# Patient Record
Sex: Male | Born: 1990
Health system: Southern US, Community
[De-identification: ages and names within clinical notes are randomized; demographics above are authoritative.]

---
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
## 2013-08-11 DIAGNOSIS — F331 Major depressive disorder, recurrent, moderate: Secondary | ICD-10-CM | POA: Insufficient documentation

## 2013-08-11 DIAGNOSIS — F1210 Cannabis abuse, uncomplicated: Secondary | ICD-10-CM | POA: Insufficient documentation

## 2013-08-11 DIAGNOSIS — F1011 Alcohol abuse, in remission: Secondary | ICD-10-CM | POA: Insufficient documentation

## 2013-08-11 DIAGNOSIS — F1511 Other stimulant abuse, in remission: Secondary | ICD-10-CM | POA: Insufficient documentation

## 2013-09-15 DIAGNOSIS — F3341 Major depressive disorder, recurrent, in partial remission: Secondary | ICD-10-CM | POA: Insufficient documentation

## 2019-06-16 ENCOUNTER — Emergency Department
Admission: EM | Admit: 2019-06-16 | Discharge: 2019-06-16 | Disposition: A | Discharge status: Home / Self Care | Payer: Self-pay | Source: Home / Self Care

## 2019-06-16 ENCOUNTER — Other Ambulatory Visit: Payer: Self-pay

## 2019-06-16 ENCOUNTER — Emergency Department (INDEPENDENT_AMBULATORY_CARE_PROVIDER_SITE_OTHER): Payer: Managed Care, Other (non HMO)

## 2019-06-16 DIAGNOSIS — Z9181 History of falling: Secondary | ICD-10-CM

## 2019-06-16 DIAGNOSIS — W19XXXA Unspecified fall, initial encounter: Secondary | ICD-10-CM

## 2019-06-16 DIAGNOSIS — M25552 Pain in left hip: Secondary | ICD-10-CM | POA: Diagnosis not present

## 2019-06-16 MED ORDER — MELOXICAM 15 MG PO TABS: 15.0000 mg | ORAL_TABLET | Freq: Every day | ORAL | 2 refills | Status: AC

## 2019-06-16 NOTE — ED Triage Notes (Signed)
Hip pain x 2 weeks. Slipped on ice at work. He works in a freezer and keeps reaggrivating the injury. Pain 4/10. Alternating heat/ice. Ibuprofen prn.

## 2019-06-16 NOTE — ED Provider Notes (Signed)
[facility]    CSN: [id number] Arrival date & time: 06/16/19  3220      History   Chief Complaint Chief Complaint  Patient presents with  . Hip Pain    HPI Brad Becker is a 29 y.o. male.   The history is provided by the patient. No language interpreter was used.  Hip Pain This is a new problem. Episode onset: 2 weeks ago  The problem occurs constantly. The problem has not changed since onset.Nothing aggravates the symptoms. Nothing relieves the symptoms. He has tried nothing for the symptoms. The treatment provided no relief.  Pt reports he slipped an injured his hip 2 weeks ago.  Pt reports he has continued to have pain   No past medical history on file.  There are no problems to display for this patient.        Home Medications    Prior to Admission medications   Not on File    Family History No family history on file.  Social History Social History   Tobacco Use  . Smoking status: Never Smoker  . Smokeless tobacco: Never Used  Substance Use Topics  . Alcohol use: Never  . Drug use: Not on file     Allergies   Patient has no known allergies.   Review of Systems Review of Systems  Musculoskeletal: Positive for arthralgias. Negative for joint swelling.  All other systems reviewed and are negative.    Physical Exam Triage Vital Signs ED Triage Vitals  Enc Vitals Group     BP 06/16/19 1018 114/72     Pulse Rate 06/16/19 1018 67     Resp 06/16/19 1018 18     Temp 06/16/19 1018 98.4 F (36.9 C)     Temp Source 06/16/19 1018 Oral     SpO2 06/16/19 1018 98 %     Weight 06/16/19 1019 202 lb (91.6 kg)     Height 06/16/19 1019 6\' 7"  (2.007 m)     Head Circumference --      Peak Flow --      Pain Score 06/16/19 1019 4     Pain Loc --      Pain Edu? --      Excl. in GC? --    No data found.  Updated Vital Signs BP 114/72 (BP Location: Right Arm)   Pulse 67   Temp 98.4 F (36.9 C) (Oral)   Resp 18   Ht 6\' 7"  (2.007 m)    Wt 91.6 kg   SpO2 98%   BMI 22.76 kg/m   Visual Acuity Right Eye Distance:   Left Eye Distance:   Bilateral Distance:    Right Eye Near:   Left Eye Near:    Bilateral Near:     Physical Exam Vitals reviewed.  Cardiovascular:     Rate and Rhythm: Normal rate.  Pulmonary:     Effort: Pulmonary effort is normal.  Musculoskeletal:        General: Tenderness and signs of injury present. No swelling or deformity.     Comments: Tender left hip anteriorly, pain with range of motion.  Pain with abduction,  From  nv and ns intact   Skin:    General: Skin is warm.  Neurological:     General: No focal deficit present.     Mental Status: He is alert.  Psychiatric:        Mood and Affect: Mood normal.      UC Treatments /  Results  Labs (all labs ordered are listed, but only abnormal results are displayed) Labs Reviewed - No data to display  EKG   Radiology DG HIP UNILAT WITH PELVIS 2-3 VIEWS LEFT  Result Date: 06/16/2019 CLINICAL DATA:  Left hip pain for 2 weeks following repeated falls, initial encounter EXAM: DG HIP (WITH OR WITHOUT PELVIS) 3V LEFT COMPARISON:  None. FINDINGS: Pelvic ring is intact. No acute fracture or dislocation is noted. No gross soft tissue abnormality is seen. IMPRESSION: No acute abnormality noted. Electronically Signed   By: Inez Catalina M.D.   On: 06/16/2019 11:00    Procedures Procedures (including critical care time)  Medications Ordered in UC Medications - No data to display  Initial Impression / Assessment and Plan / UC Course  I have reviewed the triage vital signs and the nursing notes.  Pertinent labs & imaging results that were available during my care of the patient were reviewed by me and considered in my medical decision making (see chart for details).     MDM:  Xray no acute abnormality.  I suspect muscle sprain/tear.  I will try pt on antiinflammatory and exercises.  I will refer pt to Dr. Darene Lamer for recheck  Final Clinical  Impressions(s) / UC Diagnoses   Final diagnoses:  Left hip pain   Discharge Instructions   None    ED Prescriptions    Medication Sig Dispense Auth. Provider   meloxicam (MOBIC) 15 MG tablet Take 1 tablet (15 mg total) by mouth daily. 30 tablet Fransico Meadow, [place]     PDMP not reviewed this encounter.  An After Visit Summary was printed and given to the patient.    Fransico Meadow, [place] 06/16/19 1129

## 2019-06-21 ENCOUNTER — Encounter: Payer: Self-pay | Admitting: Family Medicine

## 2019-06-21 ENCOUNTER — Other Ambulatory Visit: Payer: Self-pay

## 2019-06-21 ENCOUNTER — Ambulatory Visit: Payer: Managed Care, Other (non HMO) | Admitting: Family Medicine

## 2019-06-21 ENCOUNTER — Ambulatory Visit: Payer: Self-pay

## 2019-06-21 VITALS — BP 110/80 | HR 61 | Ht 79.0 in | Wt 202.0 lb

## 2019-06-21 DIAGNOSIS — R1032 Left lower quadrant pain: Secondary | ICD-10-CM | POA: Diagnosis not present

## 2019-06-21 DIAGNOSIS — S76012A Strain of muscle, fascia and tendon of left hip, initial encounter: Secondary | ICD-10-CM | POA: Diagnosis not present

## 2019-06-21 NOTE — Progress Notes (Signed)
    Subjective:    CC: L hip pain  I, Molly Weber, LAT, ATC, am serving as scribe for Dr. Clementeen Graham.  HPI: Pt is a 29 y/o male presenting w/ c/o L hip pain after slipping at work approximately 2 weeks ago that caused him to land in a diagonal split position with his L leg behind.  Pt was seen at the [facility] on 06/16/19 and had a L hip XR and prescribed Meloxicam.  He locates his pain to his L lower abdomen and notes that he feels tightness that runs from his L lower abdomen into his L ant thigh.  Pt rates his pain at a 7/10 and describes his pain as sharp, aching and throbbing.  Pt reports some tingling into the lateral aspect of his L hip.  He notes some popping/snapping across the front of his L hip.  He reports a "knot" near his L proximal groin.  Aggravating factors include walking, active hip aBd and flexion.  Treatments tried: heat, Meloxicam, stretches, steroid dose pack  Did not report as worker comp injury.   Pertinent review of Systems: No fever, chills, NVD  Relevant historical information: History substance abuse in remission. No history of hip injury in the past.    Objective:    Vitals:   06/21/19 1050  BP: 110/80  Pulse: 61  SpO2: 98%   General: Well Developed, well nourished, and in no acute distress.   MSK:  Left hip: Normal-appearing Normal motion. Pain with resisted hip flexion. Palpation overlying anterior hip and groin is somewhat painful especially with resisted hip flexion.  He is thin enough that the deep hip flexors are palpable and tender.  No palpable tendon defect or muscle defect in this area. Normal lymph nodes in inguinal chain.   Lab and Radiology Results Limited musculoskeletal ultrasound left hip Normal-appearing femoral acetabular joint.  Trace hip effusion present. Hip flexor tendons not visible and pelvis due to overlying intestinal gas.    Impression and Recommendations:    Assessment and Plan: 29 y.o. male  with left anterior hip pain.  Occurring during injury at work.  Pain due to hip flexor strain.  Plan for home exercise program as taught by ATC in clinic today.  Additionally recommend physical therapy as this should be quite helpful.  Recheck back if not improving..   Orders Placed This Encounter  Procedures  . [place] LIMITED JOINT SPACE STRUCTURES LOW LEFT(NO LINKED CHARGES)    Order Specific Question:   Reason for Exam (SYMPTOM  OR DIAGNOSIS REQUIRED)    Answer:   L hip pain    Order Specific Question:   Preferred imaging location?    Answer:   Adult nurse [facility]  . Ambulatory referral to Physical Therapy    Referral Priority:   Routine    Referral Type:   Physical Medicine    Referral Reason:   Specialty Services Required    Requested Specialty:   Physical Therapy   No orders of the defined types were placed in this encounter.   Discussed warning signs or symptoms. Please see discharge instructions. Patient expresses understanding.   The above documentation has been reviewed and is accurate and complete Clementeen Graham

## 2019-06-21 NOTE — Patient Instructions (Addendum)
Thank you for coming in today.  Do the exercises we discussed.  Attend PT if not better.  Let me know if not doing well.   Please perform the exercise program that we have prepared for you and gone over in detail on a daily basis.  In addition to the handout you were provided you can access your program through: [email or web address]   Your unique program code is: Larey Days

## 2019-06-22 ENCOUNTER — Encounter: Payer: Self-pay | Admitting: Family Medicine

## 2019-06-22 ENCOUNTER — Telehealth: Payer: Self-pay

## 2019-06-22 NOTE — Telephone Encounter (Signed)
I called Brad Becker back. New letter written. Ready for pickup.

## 2019-06-22 NOTE — Telephone Encounter (Signed)
Patient called wanting to get another letter for work to say that he can go back to work but light duty only. Would like a call back

## 2019-06-23 ENCOUNTER — Encounter: Payer: Self-pay | Admitting: Family Medicine

## 2019-07-06 ENCOUNTER — Ambulatory Visit: Payer: Managed Care, Other (non HMO) | Admitting: Rehabilitative and Restorative Service Providers"

## 2019-08-08 ENCOUNTER — Telehealth: Payer: Self-pay | Admitting: Family Medicine

## 2019-08-08 ENCOUNTER — Encounter: Payer: Self-pay | Admitting: Family Medicine

## 2019-08-08 NOTE — Telephone Encounter (Signed)
Patient called requesting a letter for his employer allowing him to return to work with no restrictions. He would like this to be emailed to him if possible ([email or web address]).  Please advise.

## 2019-08-08 NOTE — Telephone Encounter (Signed)
Letter sent to pt via email.

## 2019-08-08 NOTE — Telephone Encounter (Signed)
Letter written.  One copy of the letter was sent via MyChart.  We will scan and email another copy.

## 2020-08-31 DIAGNOSIS — F4310 Post-traumatic stress disorder, unspecified: Secondary | ICD-10-CM | POA: Diagnosis not present

## 2020-09-07 DIAGNOSIS — F4310 Post-traumatic stress disorder, unspecified: Secondary | ICD-10-CM | POA: Diagnosis not present

## 2020-09-13 DIAGNOSIS — F4310 Post-traumatic stress disorder, unspecified: Secondary | ICD-10-CM | POA: Diagnosis not present

## 2020-10-05 IMAGING — DX DG HIP (WITH OR WITHOUT PELVIS) 2-3V*L*
3 series · 3 of 3 positions shown · non-contrast
Comparison: None.

CLINICAL DATA: Left hip pain for 2 weeks following repeated falls,
initial encounter

EXAM:
DG HIP (WITH OR WITHOUT PELVIS) 3V LEFT

[pelvis ap]
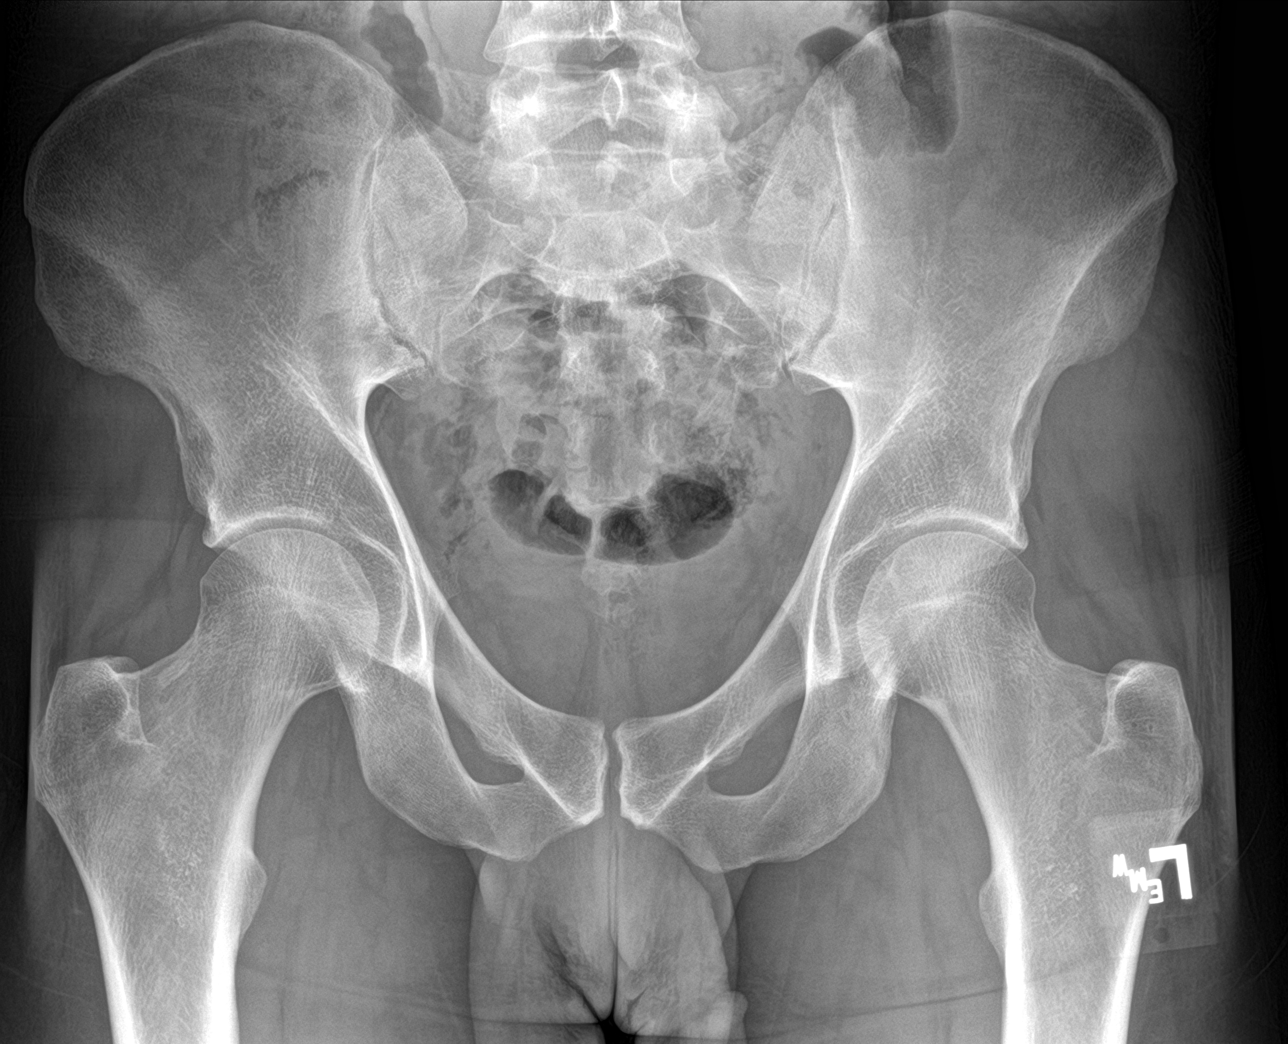

[hip ap]
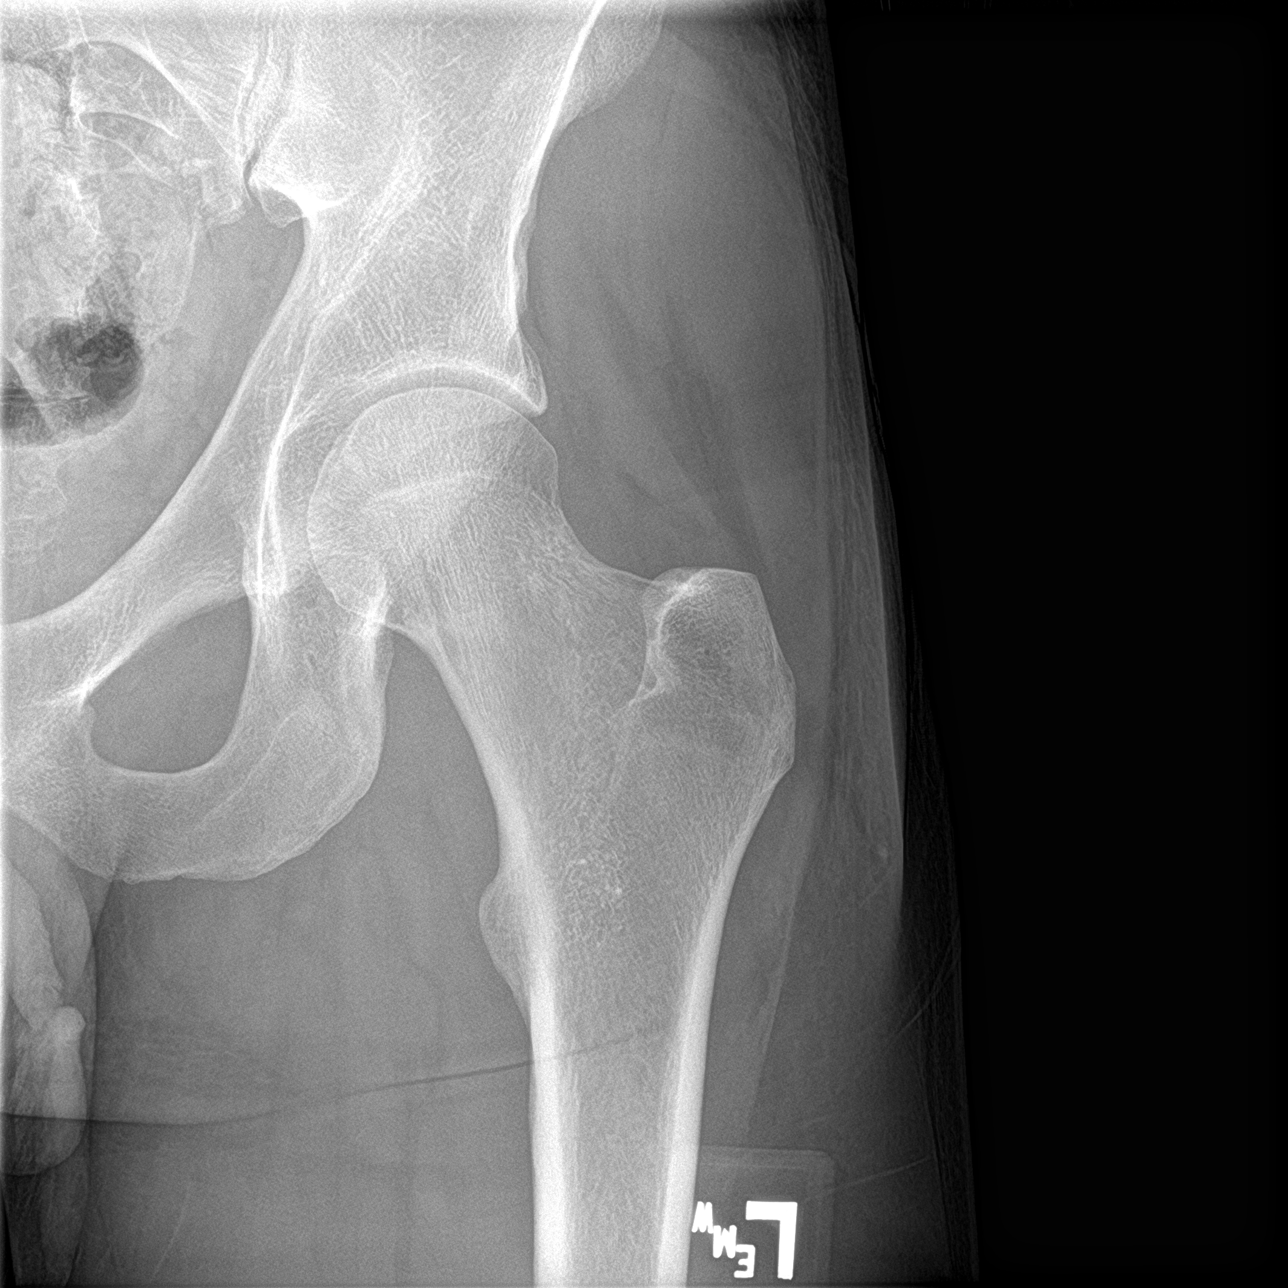

[hip lat]
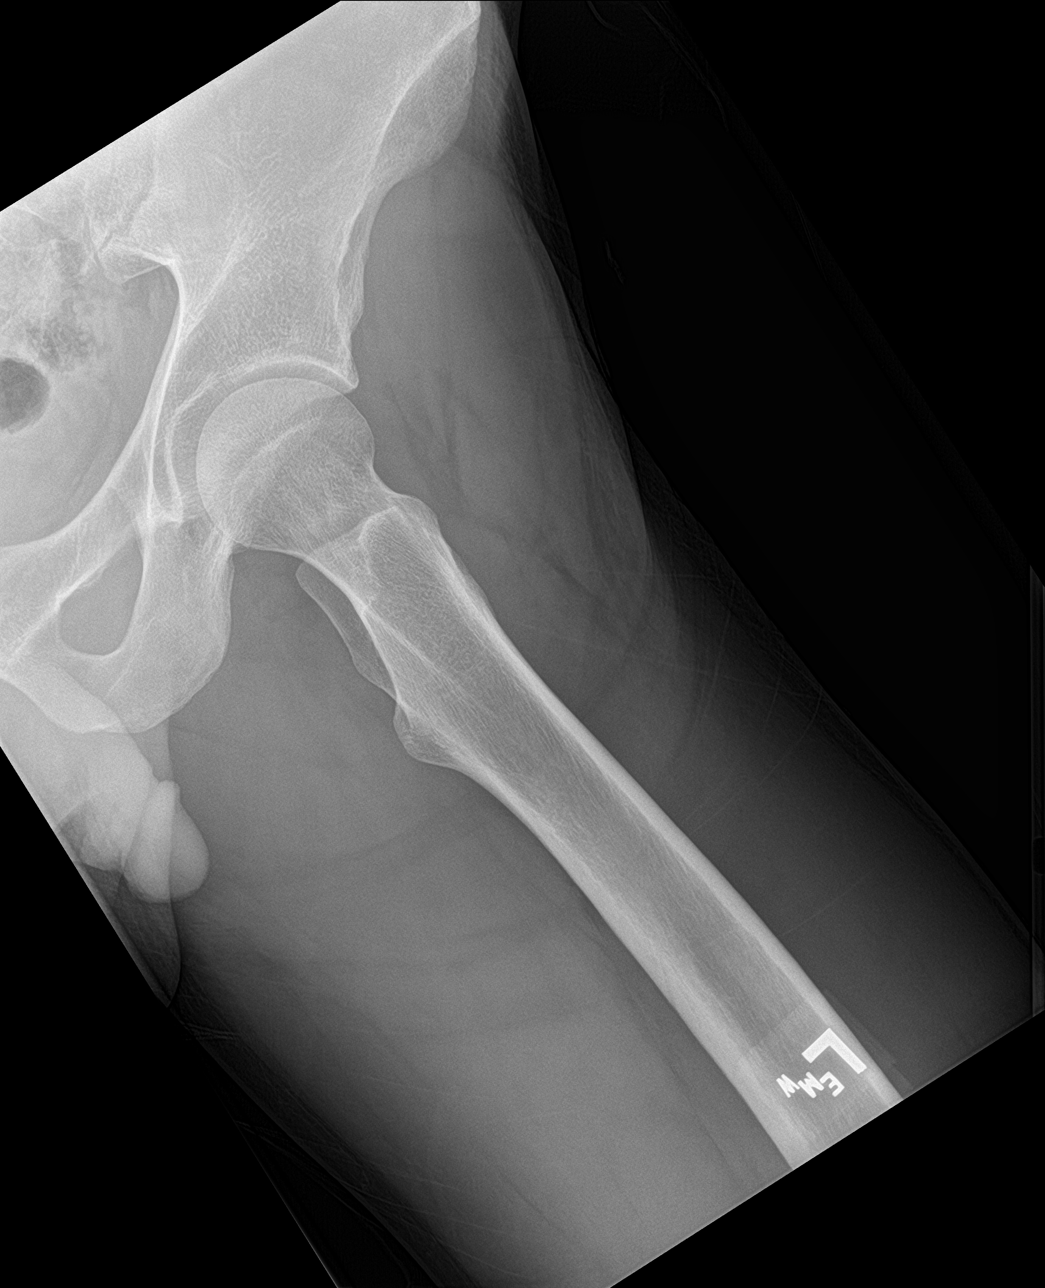

[3 of 3 positions shown; findings below may reference images not displayed]

FINDINGS: Pelvic ring is intact. No acute fracture or dislocation is noted. No
gross soft tissue abnormality is seen.
IMPRESSION: No acute abnormality noted.

## 2020-11-12 DIAGNOSIS — F4310 Post-traumatic stress disorder, unspecified: Secondary | ICD-10-CM | POA: Diagnosis not present

## 2021-01-29 ENCOUNTER — Encounter: Payer: Self-pay | Admitting: Physician Assistant

## 2021-01-29 ENCOUNTER — Telehealth: Payer: BC Managed Care – PPO | Admitting: Physician Assistant

## 2021-01-29 DIAGNOSIS — B9689 Other specified bacterial agents as the cause of diseases classified elsewhere: Secondary | ICD-10-CM | POA: Diagnosis not present

## 2021-01-29 DIAGNOSIS — J019 Acute sinusitis, unspecified: Secondary | ICD-10-CM | POA: Diagnosis not present

## 2021-01-29 MED ORDER — AMOXICILLIN-POT CLAVULANATE 875-125 MG PO TABS: 1.0000 | ORAL_TABLET | Freq: Two times a day (BID) | ORAL | 0 refills | Status: AC

## 2021-01-29 MED ORDER — FLUTICASONE PROPIONATE 50 MCG/ACT NA SUSP: 2.0000 | Freq: Every day | NASAL | 0 refills | Status: AC

## 2021-01-29 NOTE — Patient Instructions (Signed)
[name], thank you for joining [name], PA-C for today's virtual visit.  While this provider is not your primary care provider (PCP), if your PCP is located in our provider database this encounter information will be shared with them immediately following your visit.  Consent: (Patient) Brad Becker provided verbal consent for this virtual visit at the beginning of the encounter.  Current Medications:  Current Outpatient Medications:    amoxicillin-clavulanate (AUGMENTIN) 875-125 MG tablet, Take 1 tablet by mouth 2 (two) times daily., Disp: 14 tablet, Rfl: 0   fluticasone (FLONASE) 50 MCG/ACT nasal spray, Place 2 sprays into both nostrils daily., Disp: 16 g, Rfl: 0   Medications ordered in this encounter:  Meds ordered this encounter  Medications   amoxicillin-clavulanate (AUGMENTIN) 875-125 MG tablet    Sig: Take 1 tablet by mouth 2 (two) times daily.    Dispense:  14 tablet    Refill:  0    Order Specific Question:   Supervising Provider    Answer:   MILLER, BRIAN [[id number]]   fluticasone (FLONASE) 50 MCG/ACT nasal spray    Sig: Place 2 sprays into both nostrils daily.    Dispense:  16 g    Refill:  0    Order Specific Question:   Supervising Provider    Answer:   Hyacinth Meeker, BRIAN [[id number]]     *If you need refills on other medications prior to your next appointment, please contact your pharmacy*  Follow-Up: Call back or seek an in-person evaluation if the symptoms worsen or if the condition fails to improve as anticipated.  Other Instructions Please take antibiotic as directed.  Increase fluid intake.  Use Saline nasal spray.  Take a daily multivitamin. Take Flonase as directed.  Place a humidifier in the bedroom.  Please call or return clinic if symptoms are not improving.  Sinusitis Sinusitis is redness, soreness, and swelling (inflammation) of the paranasal sinuses. Paranasal sinuses are air pockets within the bones of your face (beneath the eyes, the middle of  the forehead, or above the eyes). In healthy paranasal sinuses, mucus is able to drain out, and air is able to circulate through them by way of your nose. However, when your paranasal sinuses are inflamed, mucus and air can become trapped. This can allow bacteria and other germs to grow and cause infection. Sinusitis can develop quickly and last only a short time (acute) or continue over a long period (chronic). Sinusitis that lasts for more than 12 weeks is considered chronic.  CAUSES  Causes of sinusitis include: Allergies. Structural abnormalities, such as displacement of the cartilage that separates your nostrils (deviated septum), which can decrease the air flow through your nose and sinuses and affect sinus drainage. Functional abnormalities, such as when the small hairs (cilia) that line your sinuses and help remove mucus do not work properly or are not present. SYMPTOMS  Symptoms of acute and chronic sinusitis are the same. The primary symptoms are pain and pressure around the affected sinuses. Other symptoms include: Upper toothache. Earache. Headache. Bad breath. Decreased sense of smell and taste. A cough, which worsens when you are lying flat. Fatigue. Fever. Thick drainage from your nose, which often is green and may contain pus (purulent). Swelling and warmth over the affected sinuses. DIAGNOSIS  Your caregiver will perform a physical exam. During the exam, your caregiver may: Look in your nose for signs of abnormal growths in your nostrils (nasal polyps). Tap over the affected sinus to check for signs of  infection. View the inside of your sinuses (endoscopy) with a special imaging device with a light attached (endoscope), which is inserted into your sinuses. If your caregiver suspects that you have chronic sinusitis, one or more of the following tests may be recommended: Allergy tests. Nasal culture A sample of mucus is taken from your nose and sent to a lab and screened for  bacteria. Nasal cytology A sample of mucus is taken from your nose and examined by your caregiver to determine if your sinusitis is related to an allergy. TREATMENT  Most cases of acute sinusitis are related to a viral infection and will resolve on their own within 10 days. Sometimes medicines are prescribed to help relieve symptoms (pain medicine, decongestants, nasal steroid sprays, or saline sprays).  However, for sinusitis related to a bacterial infection, your caregiver will prescribe antibiotic medicines. These are medicines that will help kill the bacteria causing the infection.  Rarely, sinusitis is caused by a fungal infection. In theses cases, your caregiver will prescribe antifungal medicine. For some cases of chronic sinusitis, surgery is needed. Generally, these are cases in which sinusitis recurs more than 3 times per year, despite other treatments. HOME CARE INSTRUCTIONS  Drink plenty of water. Water helps thin the mucus so your sinuses can drain more easily. Use a humidifier. Inhale steam 3 to 4 times a day (for example, sit in the bathroom with the shower running). Apply a warm, moist washcloth to your face 3 to 4 times a day, or as directed by your caregiver. Use saline nasal sprays to help moisten and clean your sinuses. Take over-the-counter or prescription medicines for pain, discomfort, or fever only as directed by your caregiver. SEEK IMMEDIATE MEDICAL CARE IF: You have increasing pain or severe headaches. You have nausea, vomiting, or drowsiness. You have swelling around your face. You have vision problems. You have a stiff neck. You have difficulty breathing. MAKE SURE YOU:  Understand these instructions. Will watch your condition. Will get help right away if you are not doing well or get worse. Document Released: 05/05/2005 Document Revised: 07/28/2011 Document Reviewed: 05/20/2011 [facility] Patient Information 2014 [place], [place].    If you have been  instructed to have an in-person evaluation today at a local Urgent Care facility, please use the link below. It will take you to a list of all of our available [facility], including address, phone number and hours of operation. Please do not delay care.  [facility]  If you or a family member do not have a primary care provider, use the link below to schedule a visit and establish care. When you choose a [facility] primary care physician or advanced practice provider, you gain a long-term partner in health. Find a Primary Care Provider  Learn more about [facility]'s in-office and virtual care options: [facility] - Get Care Now

## 2021-01-29 NOTE — Progress Notes (Signed)
Virtual Visit Consent   Brad Becker, you are scheduled for a virtual visit with a [facility] provider today.     Just as with appointments in the office, your consent must be obtained to participate.  Your consent will be active for this visit and any virtual visit you may have with one of our providers in the next 365 days.     If you have a MyChart account, a copy of this consent can be sent to you electronically.  All virtual visits are billed to your insurance company just like a traditional visit in the office.    As this is a virtual visit, video technology does not allow for your provider to perform a traditional examination.  This may limit your provider's ability to fully assess your condition.  If your provider identifies any concerns that need to be evaluated in person or the need to arrange testing (such as labs, EKG, etc.), we will make arrangements to do so.     Although advances in technology are sophisticated, we cannot ensure that it will always work on either your end or our end.  If the connection with a video visit is poor, the visit may have to be switched to a telephone visit.  With either a video or telephone visit, we are not always able to ensure that we have a secure connection.     I need to obtain your verbal consent now.   Are you willing to proceed with your visit today?    [name] has provided verbal consent on 01/29/2021 for a virtual visit (video or telephone).   Brad Becker, [place]   Date: 01/29/2021 8:23 AM   Virtual Visit via Video Note   I, Brad Becker, connected with  Takuya Lariccia  ([id number], [birth year]) on 01/29/21 at  8:00 AM EDT by a video-enabled telemedicine application and verified that I am speaking with the correct person using two identifiers.  Location: Patient: Virtual Visit Location Patient: Home Provider: Virtual Visit Location Provider: Home Office   I discussed the limitations of evaluation and management by  telemedicine and the availability of in person appointments. The patient expressed understanding and agreed to proceed.    History of Present Illness: Race Brad Becker is a 30 y.o. who identifies as a male who was assigned male at birth, and is being seen today for possible sinusitis. Notes last Friday having some mild bouts of nausea that resolved that evening. Saturday morning woke up with more substantial head and chest congestion, worsening since onset with sore throat, sinus pain and dry cough. Denies recent travel. Has taken two at-home COVID tests since onset of symptoms which were negative.  HPI: HPI  Problems:  Patient Active Problem List   Diagnosis Date Noted   MDD (major depressive disorder), recurrent, in partial remission (HCC) 09/15/2013   Amphetamine or stimulant drug abuse, in remission (HCC) 08/11/2013   Major depressive disorder, recurrent, moderate (HCC) 08/11/2013   Nondependent alcohol abuse, in remission 08/11/2013   Nondependent cannabis abuse 08/11/2013   Exercise-induced asthma 11/25/2011   H/O infectious mononucleosis 06/02/2011    Allergies: No Known Allergies Medications:  Current Outpatient Medications:    amoxicillin-clavulanate (AUGMENTIN) 875-125 MG tablet, Take 1 tablet by mouth 2 (two) times daily., Disp: 14 tablet, Rfl: 0   fluticasone (FLONASE) 50 MCG/ACT nasal spray, Place 2 sprays into both nostrils daily., Disp: 16 g, Rfl: 0  Observations/Objective: Patient is well-developed, well-nourished in no acute distress.  Resting comfortably  at home.  Head is normocephalic, atraumatic.  No labored breathing. Speech is clear and coherent with logical content.  Patient is alert and oriented at baseline.   Assessment and Plan: 1. Acute bacterial sinusitis - amoxicillin-clavulanate (AUGMENTIN) 875-125 MG tablet; Take 1 tablet by mouth 2 (two) times daily.  Dispense: 14 tablet; Refill: 0 - fluticasone (FLONASE) 50 MCG/ACT nasal spray; Place 2 sprays into both  nostrils daily.  Dispense: 16 g; Refill: 0 Negative COVID tests. Recommend one final 24 hours from last test just to be cautious. Rx Augmentin.  Increase fluids.  Rest.  Saline nasal spray.  Probiotic.  Mucinex as directed.  Humidifier in bedroom. Flonase per orders.  Call or return to clinic if symptoms are not improving.   Follow Up Instructions: I discussed the assessment and treatment plan with the patient. The patient was provided an opportunity to ask questions and all were answered. The patient agreed with the plan and demonstrated an understanding of the instructions.  A copy of instructions were sent to the patient via MyChart.  The patient was advised to call back or seek an in-person evaluation if the symptoms worsen or if the condition fails to improve as anticipated.  Time:  I spent 12 minutes with the patient via telehealth technology discussing the above problems/concerns.    Brad Climes, PA-C

## 2022-02-28 DIAGNOSIS — S62306A Unspecified fracture of fifth metacarpal bone, right hand, initial encounter for closed fracture: Secondary | ICD-10-CM | POA: Diagnosis not present

## 2022-02-28 DIAGNOSIS — S62336A Displaced fracture of neck of fifth metacarpal bone, right hand, initial encounter for closed fracture: Secondary | ICD-10-CM | POA: Diagnosis not present

## 2022-02-28 DIAGNOSIS — M7989 Other specified soft tissue disorders: Secondary | ICD-10-CM | POA: Diagnosis not present

## 2022-02-28 DIAGNOSIS — M79641 Pain in right hand: Secondary | ICD-10-CM | POA: Diagnosis not present

## 2022-02-28 DIAGNOSIS — S62366D Nondisplaced fracture of neck of fifth metacarpal bone, right hand, subsequent encounter for fracture with routine healing: Secondary | ICD-10-CM | POA: Diagnosis not present

## 2022-02-28 DIAGNOSIS — W228XXA Striking against or struck by other objects, initial encounter: Secondary | ICD-10-CM | POA: Diagnosis not present

## 2022-03-03 DIAGNOSIS — S62336A Displaced fracture of neck of fifth metacarpal bone, right hand, initial encounter for closed fracture: Secondary | ICD-10-CM | POA: Diagnosis not present

## 2022-03-03 DIAGNOSIS — M79641 Pain in right hand: Secondary | ICD-10-CM | POA: Diagnosis not present

## 2022-03-04 DIAGNOSIS — S62336A Displaced fracture of neck of fifth metacarpal bone, right hand, initial encounter for closed fracture: Secondary | ICD-10-CM | POA: Diagnosis not present

## 2022-03-04 DIAGNOSIS — M79641 Pain in right hand: Secondary | ICD-10-CM | POA: Diagnosis not present

## 2022-03-10 DIAGNOSIS — Z8616 Personal history of COVID-19: Secondary | ICD-10-CM | POA: Diagnosis not present

## 2022-03-10 DIAGNOSIS — F17220 Nicotine dependence, chewing tobacco, uncomplicated: Secondary | ICD-10-CM | POA: Diagnosis not present

## 2022-03-10 DIAGNOSIS — Z792 Long term (current) use of antibiotics: Secondary | ICD-10-CM | POA: Diagnosis not present

## 2022-03-10 DIAGNOSIS — J45909 Unspecified asthma, uncomplicated: Secondary | ICD-10-CM | POA: Diagnosis not present

## 2022-03-10 DIAGNOSIS — X58XXXA Exposure to other specified factors, initial encounter: Secondary | ICD-10-CM | POA: Diagnosis not present

## 2022-03-10 DIAGNOSIS — Z9889 Other specified postprocedural states: Secondary | ICD-10-CM | POA: Diagnosis not present

## 2022-03-10 DIAGNOSIS — F32A Depression, unspecified: Secondary | ICD-10-CM | POA: Diagnosis not present

## 2022-03-10 DIAGNOSIS — S62336A Displaced fracture of neck of fifth metacarpal bone, right hand, initial encounter for closed fracture: Secondary | ICD-10-CM | POA: Diagnosis not present

## 2022-03-25 DIAGNOSIS — S62336A Displaced fracture of neck of fifth metacarpal bone, right hand, initial encounter for closed fracture: Secondary | ICD-10-CM | POA: Diagnosis not present

## 2022-03-25 DIAGNOSIS — M79641 Pain in right hand: Secondary | ICD-10-CM | POA: Diagnosis not present

## 2022-04-08 DIAGNOSIS — M79641 Pain in right hand: Secondary | ICD-10-CM | POA: Diagnosis not present

## 2022-04-22 DIAGNOSIS — S62336A Displaced fracture of neck of fifth metacarpal bone, right hand, initial encounter for closed fracture: Secondary | ICD-10-CM | POA: Diagnosis not present

## 2022-06-03 DIAGNOSIS — S62336A Displaced fracture of neck of fifth metacarpal bone, right hand, initial encounter for closed fracture: Secondary | ICD-10-CM | POA: Diagnosis not present

## 2022-08-12 DIAGNOSIS — S62336A Displaced fracture of neck of fifth metacarpal bone, right hand, initial encounter for closed fracture: Secondary | ICD-10-CM | POA: Diagnosis not present

## 2022-08-12 DIAGNOSIS — M79641 Pain in right hand: Secondary | ICD-10-CM | POA: Diagnosis not present

## 2022-12-09 DIAGNOSIS — R109 Unspecified abdominal pain: Secondary | ICD-10-CM | POA: Diagnosis not present

## 2022-12-09 DIAGNOSIS — R110 Nausea: Secondary | ICD-10-CM | POA: Diagnosis not present

## 2022-12-09 DIAGNOSIS — N132 Hydronephrosis with renal and ureteral calculous obstruction: Secondary | ICD-10-CM | POA: Diagnosis not present

## 2022-12-09 DIAGNOSIS — M549 Dorsalgia, unspecified: Secondary | ICD-10-CM | POA: Diagnosis not present

## 2022-12-09 DIAGNOSIS — N201 Calculus of ureter: Secondary | ICD-10-CM | POA: Diagnosis not present

## 2022-12-10 DIAGNOSIS — N201 Calculus of ureter: Secondary | ICD-10-CM | POA: Diagnosis not present

## 2022-12-10 DIAGNOSIS — R82998 Other abnormal findings in urine: Secondary | ICD-10-CM | POA: Diagnosis not present

## 2022-12-10 DIAGNOSIS — R1031 Right lower quadrant pain: Secondary | ICD-10-CM | POA: Diagnosis not present

## 2022-12-10 DIAGNOSIS — N200 Calculus of kidney: Secondary | ICD-10-CM | POA: Diagnosis not present

## 2022-12-10 DIAGNOSIS — M545 Low back pain, unspecified: Secondary | ICD-10-CM | POA: Diagnosis not present

## 2022-12-10 DIAGNOSIS — R1011 Right upper quadrant pain: Secondary | ICD-10-CM | POA: Diagnosis not present

## 2022-12-16 DIAGNOSIS — F1010 Alcohol abuse, uncomplicated: Secondary | ICD-10-CM | POA: Diagnosis not present

## 2022-12-16 DIAGNOSIS — N132 Hydronephrosis with renal and ureteral calculous obstruction: Secondary | ICD-10-CM | POA: Diagnosis not present

## 2022-12-16 DIAGNOSIS — Z79899 Other long term (current) drug therapy: Secondary | ICD-10-CM | POA: Diagnosis not present

## 2022-12-16 DIAGNOSIS — N201 Calculus of ureter: Secondary | ICD-10-CM | POA: Diagnosis not present

## 2022-12-16 DIAGNOSIS — F1210 Cannabis abuse, uncomplicated: Secondary | ICD-10-CM | POA: Diagnosis not present

## 2022-12-16 DIAGNOSIS — Z960 Presence of urogenital implants: Secondary | ICD-10-CM | POA: Diagnosis not present
